# Patient Record
Sex: Female | Born: 1950 | Race: White | Hispanic: No | Marital: Married | Smoking: Never smoker
Health system: Southern US, Community
[De-identification: ages and names within clinical notes are randomized; demographics above are authoritative.]

## PROBLEM LIST (undated history)

## (undated) HISTORY — PX: OOPHORECTOMY: SHX86

## (undated) HISTORY — PX: CHOLECYSTECTOMY: SHX55

## (undated) HISTORY — PX: CARPAL TUNNEL RELEASE: SHX101

---

## 2012-02-26 ENCOUNTER — Other Ambulatory Visit (HOSPITAL_COMMUNITY): Payer: Self-pay | Admitting: Interventional Radiology

## 2012-02-26 ENCOUNTER — Other Ambulatory Visit (HOSPITAL_COMMUNITY): Payer: Self-pay | Admitting: Orthopaedic Surgery

## 2012-02-26 DIAGNOSIS — IMO0002 Reserved for concepts with insufficient information to code with codable children: Secondary | ICD-10-CM

## 2012-03-15 ENCOUNTER — Ambulatory Visit (HOSPITAL_COMMUNITY): Admission: RE | Admit: 2012-03-15 | Payer: Self-pay | Source: Ambulatory Visit

## 2012-04-12 ENCOUNTER — Ambulatory Visit (HOSPITAL_COMMUNITY): Payer: Self-pay

## 2016-11-05 ENCOUNTER — Encounter (INDEPENDENT_AMBULATORY_CARE_PROVIDER_SITE_OTHER): Payer: Self-pay | Admitting: Orthopaedic Surgery

## 2016-11-05 ENCOUNTER — Ambulatory Visit (INDEPENDENT_AMBULATORY_CARE_PROVIDER_SITE_OTHER): Payer: Medicare HMO

## 2016-11-05 ENCOUNTER — Ambulatory Visit (INDEPENDENT_AMBULATORY_CARE_PROVIDER_SITE_OTHER): Payer: Medicare HMO | Admitting: Orthopaedic Surgery

## 2016-11-05 VITALS — BP 116/67 | HR 70 | Resp 14 | Ht 62.0 in | Wt 125.0 lb

## 2016-11-05 DIAGNOSIS — M25511 Pain in right shoulder: Secondary | ICD-10-CM

## 2016-11-05 MED ORDER — METHYLPREDNISOLONE ACETATE 40 MG/ML IJ SUSP
80.0000 mg | INTRAMUSCULAR | Status: AC | PRN
  Administered 2016-11-05: 80 mg

## 2016-11-05 MED ORDER — BUPIVACAINE HCL 0.5 % IJ SOLN
2.0000 mL | INTRAMUSCULAR | Status: AC | PRN
  Administered 2016-11-05: 2 mL via INTRA_ARTICULAR

## 2016-11-05 MED ORDER — LIDOCAINE HCL 1 % IJ SOLN
2.0000 mL | INTRAMUSCULAR | Status: AC | PRN
  Administered 2016-11-05: 2 mL

## 2016-11-05 NOTE — Progress Notes (Signed)
Office Visit Note   Patient: Lori Patterson           Date of Birth: 10/14/1950           MRN: [id number] Visit Date: 11/05/2016              Requested by: No referring provider defined for this encounter. PCP: No primary care provider on file.   Assessment & Plan: Visit Diagnoses:  1. Acute pain of right shoulder   impingement syndrome  Plan: subacromial cortisone injection right shoulder. Monitor results  Follow-Up Instructions: No Follow-up on file.   Orders:  Orders Placed This Encounter  Procedures  . XR Shoulder Right   No orders of the defined types were placed in this encounter.     Procedures: Large Joint Inj Date/Time: 11/05/2016 12:14 PM Performed by: Valeria Batman Authorized by: Valeria Batman   Consent Given by:  Patient Timeout: prior to procedure the correct patient, procedure, and site was verified   Indications:  Pain Location:  Shoulder Site:  L subacromial bursa Prep: patient was prepped and draped in usual sterile fashion   Needle Size:  25 G Needle Length:  1.5 inches Approach:  Lateral Ultrasound Guidance: No   Fluoroscopic Guidance: No   Arthrogram: No   Medications:  80 mg methylPREDNISolone acetate 40 MG/ML; 2 mL lidocaine 1 %; 2 mL bupivacaine 0.5 % Aspiration Attempted: No   Patient tolerance:  Patient tolerated the procedure well with no immediate complications     Clinical Data: No additional findings.   Subjective: Chief Complaint  Patient presents with  . Right Shoulder - Pain    Lori Patterson is a 66 y o that presents with acute Right shoulder pain x 4 weeks. She relates she is driving a lot for work, the pain awakens hurt and feels "cold". Radiation to her R elbow and also into her neck.  insidious onset of right shoulder pain approximately a month ago. No injury or trauma. Having difficulty sleeping at night lying on her right shoulder. No cervical spine symptoms.. No numbness or tingling.  No shortness of breath  or chest pain. No erythema or ecchymosis. Pain seems to be localized along the anterior lateral subacromial region.  HPI  Review of Systems  Constitutional: Negative for chills, fatigue and fever.  Eyes: Negative for itching.  Respiratory: Negative for chest tightness and shortness of breath.   Cardiovascular: Negative for chest pain, palpitations and leg swelling.  Gastrointestinal: Negative for blood in stool, constipation and diarrhea.  Endocrine: Negative for polyuria.  Genitourinary: Negative for dysuria.  Musculoskeletal: Positive for neck pain and neck stiffness. Negative for back pain and joint swelling.  Allergic/Immunologic: Negative for immunocompromised state.  Neurological: Negative for dizziness and numbness.  Hematological: Does not bruise/bleed easily.  Psychiatric/Behavioral: The patient is not nervous/anxious.      Objective: Vital Signs: BP 116/67   Pulse 70   Resp 14   Ht  (1.575 m)   Wt 125 lb (56.7 kg)   BMI 22.86 kg/m   Physical Exam  Ortho Exam awake alert and oriented 3. Comfortable sitting. Minimal pain with overhead motion of right shoulder. Only positive impingement testing. Minimally positive empty can testing compared to left shoulder. Mild tenderness in the area of the acromioclavicular joint right shoulder and anterior subacromial region. Biceps intact.No loss of motion. Good strength. Good grip and good release. Neurovascular Exam intact  No specialty comments available.  Imaging: No results found.  PMFS History: There are no active problems to display for this patient.  No past medical history on file.  Family History  Problem Relation Age of Onset  . Diabetes Mother     Past Surgical History:  Procedure Laterality Date  . CARPAL TUNNEL RELEASE Bilateral   . SHOULDER SURGERY     Social History   Occupational History  . Not on file.   Social History Main Topics  . Smoking status: Never Smoker  . Smokeless tobacco:  Never Used  . Alcohol use No  . Drug use: No  . Sexual activity: Not on file

## 2016-11-12 ENCOUNTER — Ambulatory Visit (INDEPENDENT_AMBULATORY_CARE_PROVIDER_SITE_OTHER): Payer: Self-pay | Admitting: Orthopedic Surgery

## 2016-11-21 ENCOUNTER — Ambulatory Visit (INDEPENDENT_AMBULATORY_CARE_PROVIDER_SITE_OTHER): Payer: Self-pay | Admitting: Orthopaedic Surgery

## 2017-01-28 ENCOUNTER — Encounter (INDEPENDENT_AMBULATORY_CARE_PROVIDER_SITE_OTHER): Payer: Self-pay | Admitting: Orthopedic Surgery

## 2017-01-28 ENCOUNTER — Ambulatory Visit (INDEPENDENT_AMBULATORY_CARE_PROVIDER_SITE_OTHER): Payer: Medicare HMO | Admitting: Orthopedic Surgery

## 2017-01-28 ENCOUNTER — Ambulatory Visit (INDEPENDENT_AMBULATORY_CARE_PROVIDER_SITE_OTHER): Payer: Medicare HMO

## 2017-01-28 VITALS — BP 125/63 | HR 72 | Resp 16 | Ht 60.0 in

## 2017-01-28 DIAGNOSIS — M79671 Pain in right foot: Secondary | ICD-10-CM

## 2017-01-28 DIAGNOSIS — M84374A Stress fracture, right foot, initial encounter for fracture: Secondary | ICD-10-CM

## 2017-01-28 MED ORDER — DICLOFENAC SODIUM 1 % TD GEL: 2.0000 g | Freq: Four times a day (QID) | TRANSDERMAL | 3 refills | Status: DC

## 2017-01-28 NOTE — Progress Notes (Signed)
Office Visit Note   Patient: Lori Patterson           Date of Birth: Oct 04, 1950           MRN: [id number] Visit Date: 01/28/2017              Requested by: No referring provider defined for this encounter. PCP: Elijio MilesWeaver, John W., MD   Assessment & Plan: Visit Diagnoses:  1. Stress fracture of metatarsal bone of right foot, initial encounter   2. Pain in right foot     Plan:  #1: I am going to send her over to the brace shop for an equalizer boot. We did not have a boot to satisfactorily fit her #2: She will check with her medical doctor about Prolia #3: She'll wear the boot for about 2 weeks and then she can shed it and if she is asymptomatic she does not need to return. If it recurs then she needs to follow back up with us  Follow-Up Instructions: Return if symptoms worsen or fail to improve.   Orders:  Orders Placed This Encounter  Procedures  . XR Foot Complete Right   Meds ordered this encounter  Medications  . diclofenac sodium (VOLTAREN) 1 % GEL    Sig: Apply 2-4 g topically 4 (four) times daily.    Dispense:  5 Tube    Refill:  3    Order Specific Question:   Supervising Provider    Answer:   Valeria BatmanWHITFIELD, PETER W [[id number]]      Procedures: No procedures performed   Clinical Data: No additional findings.   Subjective: Chief Complaint  Patient presents with  . Right Foot - Pain    HPI  Lori BeneJoyce is seen today for evaluation of her right foot. She's been having pain in the lateral aspect of her right foot about the fifth metatarsal proximally. These been intermittent and she does have improvement after sleep. However at the end of the day she's been having pain and discomfort in the lateral aspect of the foot. She does have a history of osteopenia and it actually had a five-year stent of Fosamax. Her insurance company has refused prolia. Seen today for evaluation.  Review of Systems  Musculoskeletal: Positive for back pain.  All other systems reviewed and are  negative.    Objective: Vital Signs: BP 125/63 (BP Location: Left Arm, Patient Position: Sitting, Cuff Size: Normal)   Pulse 72   Resp 16   Ht 5' (1.524 m)   BMI 24.41 kg/m   Physical Exam  Ortho Exam  Specialty Comments:  No specialty comments available.  Imaging: Xr Foot Complete Right  Result Date: 01/28/2017 Three-view x-ray of the right foot reveals what appears to be a healing fracture of the proximal fifth metatarsal. She does have a os peroneum noted.    PMFS History: There are no active problems to display for this patient.  History reviewed. No pertinent past medical history.  Family History  Problem Relation Age of Onset  . Diabetes Mother     Past Surgical History:  Procedure Laterality Date  . CARPAL TUNNEL RELEASE Bilateral   . CHOLECYSTECTOMY    . OOPHORECTOMY     Social History   Occupational History  . Not on file  Tobacco Use  . Smoking status: Never Smoker  . Smokeless tobacco: Never Used  Substance and Sexual Activity  . Alcohol use: Yes    Comment: occasional   . Drug use: No  .  Sexual activity: Not on file

## 2017-02-25 ENCOUNTER — Telehealth (INDEPENDENT_AMBULATORY_CARE_PROVIDER_SITE_OTHER): Payer: Self-pay | Admitting: Orthopaedic Surgery

## 2017-02-25 NOTE — Telephone Encounter (Signed)
Lori Patterson left a voicemail stating that there was no diagnosis code on the fax that was sent over for this patient.  (No other information was left on machine)  CB # [phone]  Please advise

## 2017-02-26 NOTE — Telephone Encounter (Signed)
re-faxed

## 2017-03-09 ENCOUNTER — Encounter (INDEPENDENT_AMBULATORY_CARE_PROVIDER_SITE_OTHER): Payer: Self-pay | Admitting: Orthopaedic Surgery

## 2017-03-09 ENCOUNTER — Ambulatory Visit (INDEPENDENT_AMBULATORY_CARE_PROVIDER_SITE_OTHER): Payer: Medicare HMO | Admitting: Orthopaedic Surgery

## 2017-03-09 ENCOUNTER — Encounter (INDEPENDENT_AMBULATORY_CARE_PROVIDER_SITE_OTHER): Payer: Self-pay

## 2017-03-09 VITALS — BP 115/68 | HR 83 | Resp 14 | Ht 63.0 in | Wt 130.0 lb

## 2017-03-09 DIAGNOSIS — M25511 Pain in right shoulder: Secondary | ICD-10-CM | POA: Diagnosis not present

## 2017-03-09 DIAGNOSIS — G8929 Other chronic pain: Secondary | ICD-10-CM

## 2017-03-09 MED ORDER — METHYLPREDNISOLONE ACETATE 40 MG/ML IJ SUSP
40.0000 mg | INTRAMUSCULAR | Status: AC | PRN
  Administered 2017-03-09: 40 mg via INTRAMUSCULAR

## 2017-03-09 MED ORDER — LIDOCAINE HCL 1 % IJ SOLN
2.0000 mL | INTRAMUSCULAR | Status: AC | PRN
  Administered 2017-03-09: 2 mL

## 2017-03-09 NOTE — Progress Notes (Signed)
Office Visit Note   Patient: Lori Patterson           Date of Birth: 22-Oct-1950           MRN: [id number] Visit Date: 03/09/2017              Requested by: Elijio MilesWeaver, John W., MD [address] [place], [place] [id number] PCP: Elijio MilesWeaver, John W., MD   Assessment & Plan: Visit Diagnoses:  1. Chronic right shoulder pain     Plan: Areas of trigger point tenderness along the levator scapular muscle right shoulder. We'll inject these with Depo-Medrol and monitor her response. Also try Voltaren gel. Has a separate issue with her right shoulder we'll consider injecting that the next 3-4 weeks if no improvement with the above  Follow-Up Instructions: Return if symptoms worsen or fail to improve.   Orders:  Orders Placed This Encounter  Procedures  . Trigger Point Inj   No orders of the defined types were placed in this encounter.     Procedures: Trigger Point Inj Date/Time: 03/09/2017 2:49 PM Performed by: Valeria BatmanWhitfield, Jahlil Ziller W, MD Authorized by: Valeria BatmanWhitfield, Tyianna Menefee W, MD   Consent Given by:  Patient Indications:  Pain Total # of Trigger Points:  3 or more Location: shoulder   Needle Size:  27 G Approach:  Dorsal Medications #1:  2 mL lidocaine 1 % Medications #2:  40 mg methylPREDNISolone acetate 40 MG/ML     Clinical Data: No additional findings.   Subjective: Chief Complaint  Patient presents with  . Right Shoulder - Follow-up, Pain  Lori BeneJoyce was seen at the end of September 2018 for right shoulder pain. She had evidence of impingement. Subacromial cortisone injection  resulted in excellent pain relief. She's had some recurrence associated with some areas of trigger point tenderness along the vertebral border of the right scapula and levator scapular muscle. No injury or trauma. No numbness or tingling or specific neck pain. Spends a lot of time in the car during the course of her work day  HPI  Review of Systems  Constitutional: Negative for chills, fatigue and fever.  Eyes:  Negative for itching.  Respiratory: Negative for chest tightness and shortness of breath.   Cardiovascular: Negative for chest pain, palpitations and leg swelling.  Gastrointestinal: Negative for blood in stool, constipation and diarrhea.  Endocrine: Negative for polyuria.  Genitourinary: Negative for dysuria.  Musculoskeletal: Positive for neck stiffness. Negative for back pain, joint swelling and neck pain.  Allergic/Immunologic: Negative for immunocompromised state.  Neurological: Negative for dizziness and numbness.  Hematological: Does not bruise/bleed easily.  Psychiatric/Behavioral: Positive for sleep disturbance. The patient is not nervous/anxious.      Objective: Vital Signs: BP 115/68   Pulse 83   Resp 14   Ht 5\' 3"  (1.6 m)   Wt 130 lb (59 kg)   BMI 23.03 kg/m   Physical Exam  Ortho Exam appears to have 2 separate problems with her right shoulder. She had several areas of trigger point tenderness along the levator scapular muscle. Muscle is tight. Some mild areas of tenderness along the vertebral border of the scapula. No significant impingement or empty can testing right shoulder. Full quick overhead motion with good strength. Biceps intact neurovascular exam intact. No pain to range of motion of the cervical spine  Specialty Comments:  No specialty comments available.  Imaging: No results found.   PMFS History: There are no active problems to display for this patient.  History reviewed. No pertinent  past medical history.  Family History  Problem Relation Age of Onset  . Diabetes Mother     Past Surgical History:  Procedure Laterality Date  . CARPAL TUNNEL RELEASE Bilateral   . CHOLECYSTECTOMY    . OOPHORECTOMY     Social History   Occupational History  . Not on file  Tobacco Use  . Smoking status: Never Smoker  . Smokeless tobacco: Never Used  Substance and Sexual Activity  . Alcohol use: Yes    Comment: occasional   . Drug use: No  . Sexual  activity: Not on file

## 2019-03-30 ENCOUNTER — Ambulatory Visit: Payer: Medicare HMO | Admitting: Orthopaedic Surgery

## 2019-03-30 ENCOUNTER — Encounter: Payer: Self-pay | Admitting: Orthopaedic Surgery

## 2019-03-30 ENCOUNTER — Other Ambulatory Visit: Payer: Self-pay

## 2019-03-30 DIAGNOSIS — G8929 Other chronic pain: Secondary | ICD-10-CM

## 2019-03-30 DIAGNOSIS — M25511 Pain in right shoulder: Secondary | ICD-10-CM | POA: Diagnosis not present

## 2019-03-30 MED ORDER — METHYLPREDNISOLONE ACETATE 40 MG/ML IJ SUSP
80.0000 mg | INTRAMUSCULAR | Status: AC | PRN
  Administered 2019-03-30: 11:00:00 80 mg via INTRA_ARTICULAR

## 2019-03-30 MED ORDER — BUPIVACAINE HCL 0.5 % IJ SOLN
2.0000 mL | INTRAMUSCULAR | Status: AC | PRN
  Administered 2019-03-30: 11:00:00 2 mL via INTRA_ARTICULAR

## 2019-03-30 MED ORDER — LIDOCAINE HCL 2 % IJ SOLN
2.0000 mL | INTRAMUSCULAR | Status: AC | PRN
  Administered 2019-03-30: 11:00:00 2 mL

## 2019-03-30 NOTE — Progress Notes (Signed)
Office Visit Note   Patient: Lori Patterson           Date of Birth: 1950/12/31           MRN: [id number] Visit Date: 03/30/2019              Requested by: Derrill Center., MD [address] [place],  [place] [address] PCP: Derrill Center., MD   Assessment & Plan: Visit Diagnoses:  1. Chronic right shoulder pain     Plan: Impingement syndrome right shoulder.  There is some associated spasm of the levator scapula.  I injected the areas of trigger point tenderness of levator scapula 2 years ago and it made a difference but I think there is a greater element of impingement today so I am going to inject the subacromial space and monitor her response.  She can try heat or the Voltaren gel or even Aspercreme for the spasm.  She has a home exercise program  Follow-Up Instructions: Return if symptoms worsen or fail to improve.   Orders:  Orders Placed This Encounter  Procedures  . Large Joint Inj: R subacromial bursa   No orders of the defined types were placed in this encounter.     Procedures: Large Joint Inj: R subacromial bursa on 03/30/2019 10:40 AM Indications: pain and diagnostic evaluation Details: 25 G 1.5 in needle, anterolateral approach  Arthrogram: No  Medications: 2 mL lidocaine 2 %; 2 mL bupivacaine 0.5 %; 80 mg methylPREDNISolone acetate 40 MG/ML Consent was given by the patient. Immediately prior to procedure a time out was called to verify the correct patient, procedure, equipment, support staff and site/side marked as required. Patient was prepped and draped in the usual sterile fashion.       Clinical Data: No additional findings.   Subjective: Chief Complaint  Patient presents with  . Right Shoulder - Pain  Patient presents today for recurrent right shoulder pain. She was last evaluated two years ago and received a trigger point injection in her shoulder. The pain that she was having before has started again in the last few weeks. Her pain is located  posteriorly and goes down her arm. She has good range of motion. Occasionally she will take something over the counter. She has taken old celebrex in her cabinet and had good relief, she took it again and had a reaction that day with itching and face redness, but not sure if it is related to the celebrex. No numbness, tingling, or weakness. The pain wakes her at night.  Seen 2 years ago for similar problem with the right shoulder.  X-rays were nondiagnostic.  I did inject the areas of trigger point tenderness about the levator scapular with some improvement.  There seems to be more of an element of shoulder pain on this occasion.  No neck discomfort.  No numbness or tingling  HPI  Review of Systems   Objective: Vital Signs: Ht 5' (1.524 m)   Wt 114 lb (51.7 kg)   BMI 22.26 kg/m   Physical Exam Constitutional:      Appearance: She is well-developed.  Eyes:     Pupils: Pupils are equal, round, and reactive to light.  Pulmonary:     Effort: Pulmonary effort is normal.  Skin:    General: Skin is warm and dry.  Neurological:     Mental Status: She is alert and oriented to person, place, and time.  Psychiatric:        Behavior:  Behavior normal.     Ortho Exam awake alert and oriented x3.  Comfortable sitting minimally positive impingement and empty can testing right shoulder.  Negative Speed sign.  Good strength.  Full range of motion with overhead and abduction but little loss of internal rotation she could touch the small of her back not the middle of her back.  She could on the left.  No grinding or crepitation.  Little bit of tenderness along the anterior subacromial region.  There was spasm of the levator scapula on the right as it was tight but minimally uncomfortable.  No pain with range of motion of the cervical spine with full flexion extension and rotation  Specialty Comments:  No specialty comments available.  Imaging: No results found.   PMFS History: Patient Active  Problem List   Diagnosis Date Noted  . Pain in right shoulder 03/30/2019   History reviewed. No pertinent past medical history.  Family History  Problem Relation Age of Onset  . Diabetes Mother     Past Surgical History:  Procedure Laterality Date  . CARPAL TUNNEL RELEASE Bilateral   . CHOLECYSTECTOMY    . OOPHORECTOMY     Social History   Occupational History  . Not on file  Tobacco Use  . Smoking status: Never Smoker  . Smokeless tobacco: Never Used  Substance and Sexual Activity  . Alcohol use: Yes    Comment: occasional   . Drug use: No  . Sexual activity: Not on file

## 2019-07-21 ENCOUNTER — Ambulatory Visit (INDEPENDENT_AMBULATORY_CARE_PROVIDER_SITE_OTHER): Payer: Medicare HMO

## 2019-07-21 ENCOUNTER — Ambulatory Visit: Payer: Medicare HMO | Admitting: Orthopedic Surgery

## 2019-07-21 ENCOUNTER — Ambulatory Visit: Payer: Self-pay

## 2019-07-21 ENCOUNTER — Other Ambulatory Visit: Payer: Self-pay

## 2019-07-21 ENCOUNTER — Encounter: Payer: Self-pay | Admitting: Orthopedic Surgery

## 2019-07-21 VITALS — Ht 60.0 in | Wt 114.0 lb

## 2019-07-21 DIAGNOSIS — M25562 Pain in left knee: Secondary | ICD-10-CM | POA: Diagnosis not present

## 2019-07-21 DIAGNOSIS — M25561 Pain in right knee: Secondary | ICD-10-CM

## 2019-07-21 DIAGNOSIS — G8929 Other chronic pain: Secondary | ICD-10-CM

## 2019-07-21 MED ORDER — METHYLPREDNISOLONE ACETATE 40 MG/ML IJ SUSP
80.0000 mg | INTRAMUSCULAR | Status: AC | PRN
  Administered 2019-07-21: 80 mg via INTRA_ARTICULAR

## 2019-07-21 MED ORDER — BUPIVACAINE HCL 0.25 % IJ SOLN
2.0000 mL | INTRAMUSCULAR | Status: AC | PRN
  Administered 2019-07-21: 2 mL via INTRA_ARTICULAR

## 2019-07-21 MED ORDER — LIDOCAINE HCL 1 % IJ SOLN
2.0000 mL | INTRAMUSCULAR | Status: AC | PRN
  Administered 2019-07-21: 2 mL

## 2019-07-21 NOTE — Progress Notes (Signed)
Office Visit Note   Patient: Lori Patterson           Date of Birth: 09-24-1950           MRN: [id number] Visit Date: 07/21/2019              Requested by: Derrill Center., MD [address] [place],  [place] [address] PCP: Derrill Center., MD   Assessment & Plan: Visit Diagnoses:  1. Chronic pain of both knees     Plan:  #1: Corticosteroid injection to the right knee was given without difficulty. #2: If she does well with this and would like to have the left knee injected she can make another appointment otherwise follow back up as needed.  Follow-Up Instructions: Return if symptoms worsen or fail to improve.   Orders:  Orders Placed This Encounter  Procedures  . XR KNEE 3 VIEW LEFT  . XR KNEE 3 VIEW RIGHT   No orders of the defined types were placed in this encounter.     Procedures: Large Joint Inj: R knee on 07/21/2019 2:01 PM Indications: pain and diagnostic evaluation Details: 25 G 1.5 in needle, anteromedial approach  Arthrogram: No  Medications: 2 mL lidocaine 1 %; 80 mg methylPREDNISolone acetate 40 MG/ML; 2 mL bupivacaine 0.25 % Outcome: tolerated well, no immediate complications Procedure, treatment alternatives, risks and benefits explained, specific risks discussed. Consent was given by the patient. Immediately prior to procedure a time out was called to verify the correct patient, procedure, equipment, support staff and site/side marked as required. Patient was prepped and draped in the usual sterile fashion.       Clinical Data: No additional findings.   Subjective: Chief Complaint  Patient presents with  . Right Knee - Pain   HPI Patient presents today for right knee pain. April 16th she was stepping back off a step stool and twisted her right knee. She said that she rested it and iced it. It hurt for a couple weeks and improved. Her pain was located laterally. She said that it would catch on and off after that injury. On May 25th she was on  her feet and walking a lot for a luncheon, and noticed that her knee was very swollen. She states that it started to catch more often and really bothered her. She started to wear a knee brace but stopped wearing it one week ago. Her knee has improved a lot since last week. She said that her left knee started bothering her in May, and feels like it started from compensating for the right knee. She said that both knees still seem to flare with prolonged activity and motion. She has taken limited amounts of OTC medicine.    Review of Systems  Constitutional: Negative for fatigue.  HENT: Negative for ear pain.   Eyes: Negative for pain.  Respiratory: Negative for shortness of breath.   Cardiovascular: Negative for leg swelling.  Gastrointestinal: Negative for constipation and diarrhea.  Endocrine: Negative for cold intolerance and heat intolerance.  Genitourinary: Negative for difficulty urinating.  Musculoskeletal: Positive for joint swelling.  Skin: Negative for rash.  Allergic/Immunologic: Negative for food allergies.  Neurological: Positive for weakness.  Hematological: Does not bruise/bleed easily.  Psychiatric/Behavioral: Negative for sleep disturbance.     Objective: Vital Signs: Ht 5' (1.524 m)   Wt 114 lb (51.7 kg)   BMI 22.26 kg/m   Physical Exam  Ortho Exam  Specialty Comments:  No specialty comments available.  Imaging: No results found.   PMFS History: Patient Active Problem List   Diagnosis Date Noted  . Pain in right shoulder 03/30/2019   History reviewed. No pertinent past medical history.  Family History  Problem Relation Age of Onset  . Diabetes Mother     Past Surgical History:  Procedure Laterality Date  . CARPAL TUNNEL RELEASE Bilateral   . CHOLECYSTECTOMY    . OOPHORECTOMY     Social History   Occupational History  . Not on file  Tobacco Use  . Smoking status: Never Smoker  . Smokeless tobacco: Never Used  Vaping Use  . Vaping Use: Never  used  Substance and Sexual Activity  . Alcohol use: Yes    Comment: occasional   . Drug use: No  . Sexual activity: Not on file

## 2019-08-31 ENCOUNTER — Other Ambulatory Visit: Payer: Self-pay

## 2019-08-31 ENCOUNTER — Encounter: Payer: Self-pay | Admitting: Orthopaedic Surgery

## 2019-08-31 ENCOUNTER — Ambulatory Visit: Payer: Medicare HMO | Admitting: Orthopaedic Surgery

## 2019-08-31 DIAGNOSIS — M170 Bilateral primary osteoarthritis of knee: Secondary | ICD-10-CM

## 2019-08-31 NOTE — Progress Notes (Signed)
Office Visit Note   Patient: Lori Patterson           Date of Birth: [birth year]           MRN: [id number] Visit Date: 08/31/2019              Requested by: Elijio Miles., MD [address] [place],  [place] [address] PCP: Elijio Miles., MD   Assessment & Plan: Visit Diagnoses:  1. Bilateral primary osteoarthritis of knee     Plan: Lori Patterson experience rather acute onset of right knee pain in April particularly on the lateral aspect of her knee.  She notes that over time the pain slowly subsided but she still had episodes of her knee "giving way.  She saw Lori Patterson last month with x-rays demonstrating significant arthritis particularly about the patellofemoral joints bilaterally but with moderate changes in both the medial lateral compartments.  He had suggested some Voltaren gel and even noted that he can she injected her right knee.  Lori Patterson relates that she never had the injection so today I am injecting her right knee.  We have also discussed use of NSAIDs and the Voltaren gel, appropriate exercises and bracing.  At some point she may be a candidate for knee replacement.  Follow-Up Instructions: Return if symptoms worsen or fail to improve.   Orders:  No orders of the defined types were placed in this encounter.  No orders of the defined types were placed in this encounter.     Procedures: No procedures performed   Clinical Data: No additional findings.   Subjective: Chief Complaint  Patient presents with  . Right Knee - Follow-up  Patient presents today for follow up on her knees. She was here on 07/21/2019 and saw Lori Patterson. She states that she was not given any cortisone injections at that visit, but was instructed to use Voltaren gel up to four times daily. She states that her knee flared up two weeks ago, but was unable to get an appointment through the front desk with Lori Patterson. Her knee has since improved and the swelling has decreased. She has stopped using the Voltaren  gel and has since went back to using her AsperCream. She does not like to take oral medications.   HPI  Review of Systems   Objective: Vital Signs: Ht 5' (1.524 m)   Wt 114 lb (51.7 kg)   BMI 22.26 kg/m   Physical Exam Constitutional:      Appearance: She is well-developed.  Eyes:     Pupils: Pupils are equal, round, and reactive to light.  Pulmonary:     Effort: Pulmonary effort is normal.  Skin:    General: Skin is warm and dry.  Neurological:     Mental Status: She is alert and oriented to person, place, and time.  Psychiatric:        Behavior: Behavior normal.     Ortho Exam awake alert and oriented x3.  Comfortable sitting minimal effusion right knee.  Pain along the lateral patella consistent with lateral patella pressure syndrome.  Films demonstrate significant patellofemoral arthritis with narrowing of the lateral patellofemoral articulation.  Full extension flexed over 105 degrees without instability.  No significant medial or lateral joint pain.  No pain along the IT band.  No calf pain.  No distal edema.  Neurologically intact.  Painless range of motion of right hip.  There was some fullness in the popliteal space probably consistent with a popliteal cyst but without pain  Specialty Comments:  No specialty comments available.  Imaging: No results found.   PMFS History: Patient Active Problem List   Diagnosis Date Noted  . Bilateral primary osteoarthritis of knee 08/31/2019  . Pain in right shoulder 03/30/2019   History reviewed. No pertinent past medical history.  Family History  Problem Relation Age of Onset  . Diabetes Mother     Past Surgical History:  Procedure Laterality Date  . CARPAL TUNNEL RELEASE Bilateral   . CHOLECYSTECTOMY    . OOPHORECTOMY     Social History   Occupational History  . Not on file  Tobacco Use  . Smoking status: Never Smoker  . Smokeless tobacco: Never Used  Vaping Use  . Vaping Use: Never used  Substance and Sexual  Activity  . Alcohol use: Yes    Comment: occasional   . Drug use: No  . Sexual activity: Not on file

## 2020-03-22 ENCOUNTER — Ambulatory Visit: Payer: Medicare HMO | Admitting: Orthopaedic Surgery

## 2020-03-22 ENCOUNTER — Telehealth: Payer: Self-pay

## 2020-03-22 ENCOUNTER — Other Ambulatory Visit: Payer: Self-pay

## 2020-03-22 ENCOUNTER — Ambulatory Visit (INDEPENDENT_AMBULATORY_CARE_PROVIDER_SITE_OTHER): Payer: Medicare HMO

## 2020-03-22 ENCOUNTER — Encounter: Payer: Self-pay | Admitting: Orthopaedic Surgery

## 2020-03-22 VITALS — Ht 60.0 in | Wt 114.0 lb

## 2020-03-22 DIAGNOSIS — M79672 Pain in left foot: Secondary | ICD-10-CM | POA: Diagnosis not present

## 2020-03-22 DIAGNOSIS — M170 Bilateral primary osteoarthritis of knee: Secondary | ICD-10-CM

## 2020-03-22 MED ORDER — BUPIVACAINE HCL 0.25 % IJ SOLN
2.0000 mL | INTRAMUSCULAR | Status: AC | PRN
  Administered 2020-03-22: 2 mL via INTRA_ARTICULAR

## 2020-03-22 NOTE — Telephone Encounter (Signed)
Patient called she wanted to schedule an appointment with Dr.Whitfield I did inform patient his next available will be 3/1 she would like to know if she can be worked into Dr.Whitfields scheduled soon for a xray call back:[phone]

## 2020-03-22 NOTE — Progress Notes (Signed)
Office Visit Note   Patient: Lori Patterson           Date of Birth: 09-07-1950           MRN: [id number] Visit Date: 03/22/2020              Requested by: Elijio Miles., MD [address] [place],  [place] [address] PCP: Elijio Miles., MD   Assessment & Plan: Visit Diagnoses:  1. Pain in left foot   2. Bilateral primary osteoarthritis of knee     Plan: Lori Patterson has documented osteoarthritis of both knees and has responded nicely to cortisone in the past. She is more symptomatic on the right and I will inject that today.  Also recently developed acute onset of lateral left foot pain near the base of the fifth metatarsal. I did not see anything of significance on the x-ray except os perineum which may or may not be symptomatic in an area of sclerosis in the cuboid. She is more tender at the very base of the fifth metatarsal might just have a peroneus brevis tendinitis. The skin was intact and had little if any tenderness to palpation. The issue is more of weightbearing. We will monitor this and consider the local cortisone injections or possibly an MRI scan if it persists. She is feeling a little better  Follow-Up Instructions: Return if symptoms worsen or fail to improve.   Orders:  Orders Placed This Encounter  Procedures  . Large Joint Inj: R knee  . XR Foot Complete Left   No orders of the defined types were placed in this encounter.     Procedures: Large Joint Inj: R knee on 03/22/2020 4:24 PM Indications: pain and diagnostic evaluation Details: 25 G 1.5 in needle, anterolateral approach  Arthrogram: No  Medications: 2 mL bupivacaine 0.25 %  12 mg betamethasone injected with Marcaine into the anterior lateral compartment right knee Procedure, treatment alternatives, risks and benefits explained, specific risks discussed. Consent was given by the patient. Immediately prior to procedure a time out was called to verify the correct patient, procedure, equipment, support  staff and site/side marked as required. Patient was prepped and draped in the usual sterile fashion.       Clinical Data: No additional findings.   Subjective: Chief Complaint  Patient presents with  . Left Foot - Pain  . Right Knee - Pain  Patient presents today for her left foot and right knee. She said that her left foot started to hurt about a week ago. No known injury. Her pain is located laterally. She only has pain with weightbearing. She also states that her right knee has been painful. She had it injected in July of last year and that helped. She has noticed that her knee is swollen posteriorly. She wants to get another injection today. She is not taking anything for pain.   HPI  Review of Systems   Objective: Vital Signs: Ht 5' (1.524 m)   Wt 114 lb (51.7 kg)   BMI 22.26 kg/m   Physical Exam Constitutional:      Appearance: She is well-developed and well-nourished.  HENT:     Mouth/Throat:     Mouth: Oropharynx is clear and moist.  Eyes:     Extraocular Movements: EOM normal.     Pupils: Pupils are equal, round, and reactive to light.  Pulmonary:     Effort: Pulmonary effort is normal.  Skin:    General: Skin is warm and  dry.  Neurological:     Mental Status: She is alert and oriented to person, place, and time.  Psychiatric:        Mood and Affect: Mood and affect normal.        Behavior: Behavior normal.     Ortho Exam awake alert and oriented x3. Comfortable sitting. Examination left foot reveals no swelling. There really was not any significant tenderness along the lateral aspect of her foot to palpation. She seems to think that when she has the pain it is at the very base of the fifth metatarsal. Peroneal tendon and function intact. No pain behind either malleolar lie. No specific tenderness over the os perineum or the cuboid where there was an area of increased density on x-ray. Neurologically intact. Motor exam intact  Specialty Comments:  No  specialty comments available.  Imaging: XR Foot Complete Left  Result Date: 03/22/2020 Films of the left foot were obtained in several projections. There is a prominent os perineum and an area of sclerosis in the cuboid which may or may not be symptomatic. No evidence of a stress fracture. No obvious arthritic change in the midfoot. Patient having occasional pain in the area of the base of the fifth metatarsal    PMFS History: Patient Active Problem List   Diagnosis Date Noted  . Pain in left foot 03/22/2020  . Bilateral primary osteoarthritis of knee 08/31/2019  . Pain in right shoulder 03/30/2019   History reviewed. No pertinent past medical history.  Family History  Problem Relation Age of Onset  . Diabetes Mother     Past Surgical History:  Procedure Laterality Date  . CARPAL TUNNEL RELEASE Bilateral   . CHOLECYSTECTOMY    . OOPHORECTOMY     Social History   Occupational History  . Not on file  Tobacco Use  . Smoking status: Never Smoker  . Smokeless tobacco: Never Used  Vaping Use  . Vaping Use: Never used  Substance and Sexual Activity  . Alcohol use: Yes    Comment: occasional   . Drug use: No  . Sexual activity: Not on file

## 2020-03-22 NOTE — Telephone Encounter (Signed)
Called and spoke with patient. Scheduled for this afternoon.  

## 2020-07-26 ENCOUNTER — Encounter: Payer: Self-pay | Admitting: Orthopaedic Surgery

## 2020-07-26 ENCOUNTER — Ambulatory Visit: Payer: Medicare HMO | Admitting: Orthopaedic Surgery

## 2020-07-26 ENCOUNTER — Other Ambulatory Visit: Payer: Self-pay

## 2020-07-26 VITALS — Ht 60.0 in | Wt 114.0 lb

## 2020-07-26 DIAGNOSIS — M79675 Pain in left toe(s): Secondary | ICD-10-CM

## 2020-07-26 DIAGNOSIS — M170 Bilateral primary osteoarthritis of knee: Secondary | ICD-10-CM | POA: Diagnosis not present

## 2020-07-26 DIAGNOSIS — M25511 Pain in right shoulder: Secondary | ICD-10-CM

## 2020-07-26 DIAGNOSIS — M79672 Pain in left foot: Secondary | ICD-10-CM | POA: Diagnosis not present

## 2020-07-26 MED ORDER — LIDOCAINE HCL 1 % IJ SOLN
1.0000 mL | INTRAMUSCULAR | Status: AC | PRN
  Administered 2020-07-26: 1 mL

## 2020-07-26 NOTE — Progress Notes (Signed)
Office Visit Note   Patient: Lori Patterson           Date of Birth: 1950-07-25           MRN: [id number] Visit Date: 07/26/2020              Requested by: Elijio Miles., MD [address] [place],  [place] [address] PCP: Elijio Miles., MD   Assessment & Plan: Visit Diagnoses:  1. Pain in left foot     Plan: Jesusita was seen earlier in the year for evaluation of left foot pain.  She did have an os perineum that could have been because of pain.  There was also an area of sclerosis in the cuboid which could have been a stress reaction.  There was no evidence of any fracture.  She has had pain off and on but a recent exacerbation.  The pain is fairly well localized near the base of the fifth metatarsal.  I am going to inject that area with betamethasone and Xylocaine and monitor response.  Consider MRI scan if no improvement.  Also has significant arthritis in both of her knees and treating it with over-the-counter medicines and exercise.  At some point she may be a candidate for knee replacement.  She has had cortisone in the past.  Also experiencing some right shoulder pain and could have a rotator cuff tear as suggested at some point when she is compromised to consider an MRI scan  Follow-Up Instructions: Return if symptoms worsen or fail to improve.   Orders:  Orders Placed This Encounter  Procedures   Foot Inj   No orders of the defined types were placed in this encounter.     Procedures: Foot Inj  Date/Time: 07/26/2020 5:26 PM Performed by: Valeria Batman, MD Authorized by: Valeria Batman, MD   Consent Given by:  Patient Site marked: the procedure site was marked   Indications:  Pain Condition: Plantar Fasciitis   Condition comment:  Midfoot pain Location: left plantar fascia muscle   Approach:  Lateral Medications:  1 mL lidocaine 1 %  6 mg betamethasone injected with Xylocaine into the lateral aspect of the midfoot   Clinical Data: No additional  findings.   Subjective: Chief Complaint  Patient presents with   Left Foot - Pain, Follow-up  Patient presents today for left foot pain. She was here four months ago with left foot pain. She said that it settled down after that visit. A few weeks ago she stood on her tip toes to reach something and that caused her left foot to flare up again. Her pain is located at the 5th metatarsal. She has tried Naproxen, and it does help with the pain, but it also causes her to retain water.   HPI  Review of Systems   Objective: Vital Signs: Ht 5' (1.524 m)   Wt 114 lb (51.7 kg)   BMI 22.26 kg/m   Physical Exam Constitutional:      Appearance: She is well-developed.  Eyes:     Pupils: Pupils are equal, round, and reactive to light.  Pulmonary:     Effort: Pulmonary effort is normal.  Skin:    General: Skin is warm and dry.  Neurological:     Mental Status: She is alert and oriented to person, place, and time.  Psychiatric:        Behavior: Behavior normal.    Ortho Exam left foot with some tenderness near the base  of the fifth metatarsal but a little bit more dorsal.  Skin intact.  No ecchymosis or erythema.  No grating or crepitation.  Seems to have more trouble when she stands on her toes then when she is on her heel.  Peroneal tendon intact.  No specific pain at the very base of the fifth metatarsal.  No ankle pain.  There is no pain behind either malleoli.  Motor and sensory exam intact  Specialty Comments:  No specialty comments available.  Imaging: No results found.   PMFS History: Patient Active Problem List   Diagnosis Date Noted   Pain in left foot 03/22/2020   Bilateral primary osteoarthritis of knee 08/31/2019   Pain in right shoulder 03/30/2019   History reviewed. No pertinent past medical history.  Family History  Problem Relation Age of Onset   Diabetes Mother     Past Surgical History:  Procedure Laterality Date   CARPAL TUNNEL RELEASE Bilateral     CHOLECYSTECTOMY     OOPHORECTOMY     Social History   Occupational History   Not on file  Tobacco Use   Smoking status: Never   Smokeless tobacco: Never  Vaping Use   Vaping Use: Never used  Substance and Sexual Activity   Alcohol use: Yes    Comment: occasional    Drug use: No   Sexual activity: Not on file

## 2020-08-27 ENCOUNTER — Other Ambulatory Visit: Payer: Self-pay

## 2020-08-27 ENCOUNTER — Telehealth: Payer: Self-pay

## 2020-08-27 DIAGNOSIS — M79672 Pain in left foot: Secondary | ICD-10-CM

## 2020-08-27 NOTE — Telephone Encounter (Signed)
MRI left foot-chronic pain near the base of the fifth metatarsal

## 2020-08-27 NOTE — Telephone Encounter (Signed)
Pt called stating that she is still having pain in her foot and would like to follow through with getting an MRI

## 2020-08-27 NOTE — Telephone Encounter (Signed)
Ordered MRI of left foot. When I let patient know she requested an [facility] at [facility] [place]. Do they do it there? If so, please change location. :)

## 2020-08-28 NOTE — Telephone Encounter (Signed)
Order changed to [facility] [place]

## 2020-08-30 ENCOUNTER — Telehealth: Payer: Self-pay | Admitting: Orthopaedic Surgery

## 2020-08-30 NOTE — Telephone Encounter (Signed)
Pt has MRI Saturday and would like someone to call her with the MRI results when they're received.   CB [phone]

## 2020-09-01 ENCOUNTER — Ambulatory Visit (INDEPENDENT_AMBULATORY_CARE_PROVIDER_SITE_OTHER): Payer: Medicare HMO

## 2020-09-01 ENCOUNTER — Other Ambulatory Visit: Payer: Self-pay

## 2020-09-01 ENCOUNTER — Ambulatory Visit (HOSPITAL_BASED_OUTPATIENT_CLINIC_OR_DEPARTMENT_OTHER): Payer: Medicare HMO

## 2020-09-01 DIAGNOSIS — M79672 Pain in left foot: Secondary | ICD-10-CM

## 2020-09-03 NOTE — Telephone Encounter (Signed)
Called with results-coming to office Tues for an equalizer boot-told her to come after 1:00 but if you prefer a specific time please call her-thanks

## 2020-09-03 NOTE — Telephone Encounter (Signed)
Holding for Lauren. ?

## 2020-09-04 ENCOUNTER — Ambulatory Visit (INDEPENDENT_AMBULATORY_CARE_PROVIDER_SITE_OTHER): Payer: Medicare HMO

## 2020-09-04 ENCOUNTER — Other Ambulatory Visit: Payer: Self-pay

## 2020-09-04 DIAGNOSIS — M79672 Pain in left foot: Secondary | ICD-10-CM

## 2020-09-04 NOTE — Progress Notes (Signed)
Fit patient with a size small boot.

## 2020-09-11 ENCOUNTER — Ambulatory Visit: Payer: Medicare HMO | Admitting: Orthopaedic Surgery

## 2020-09-25 ENCOUNTER — Encounter: Payer: Self-pay | Admitting: Orthopaedic Surgery

## 2020-09-25 ENCOUNTER — Other Ambulatory Visit: Payer: Self-pay

## 2020-09-25 ENCOUNTER — Ambulatory Visit: Payer: Medicare HMO | Admitting: Orthopaedic Surgery

## 2020-09-25 VITALS — Ht 60.0 in | Wt 114.0 lb

## 2020-09-25 DIAGNOSIS — M79672 Pain in left foot: Secondary | ICD-10-CM | POA: Diagnosis not present

## 2020-09-25 NOTE — Progress Notes (Signed)
Office Visit Note   Patient: Lori Patterson           Date of Birth: 14-Dec-1950           MRN: [id number] Visit Date: 09/25/2020              Requested by: Elijio Miles., MD [address] [place],  [place] [address] PCP: Elijio Miles., MD   Assessment & Plan: Visit Diagnoses:  1. 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  She localizes her pain to that area.  She also has an area in the cuboid which could represent stress reaction or healing stress fracture.  I am not sure that she is symptomatic in that area.  She has been wearing an equalizer boot for about 3 weeks and not sure its made much of a difference.  Her pain is still localized at the base of the fifth metatarsal or near to that consistent with the os peroneum syndrome.  I would like her to continue with the boot and or ankle support combination for another 3 to 4 weeks and then return.  And not sure another injection make much difference.  The other option would be to explore the peroneal tendons distally and remove the os perineum reattach the tendons.  Have discussed that with her briefly.  Would refer her to Dr. Lajoyce Corners should that be the decision  Follow-Up Instructions: Return in about 1 month (around 10/26/2020).   Orders:  No orders of the defined types were placed in this encounter.  No orders of the defined types were placed in this encounter.     Procedures: No procedures performed   Clinical Data: No additional findings.   Subjective: Chief Complaint  Patient presents with   Left Foot - Follow-up    MRI review  Patient is here today for follow up on her left foot. She had an MRI done and is here today to discuss those results.  Has been wearing the equalizer boot for about 3 weeks and not sure its made much of a difference.   The pain is still localized near the base of the left fifth metatarsal  HPI  Review of Systems   Objective: Vital Signs: Ht 5' (1.524 m)   Wt 114 lb (51.7 kg)   BMI 22.26 kg/m   Physical Exam Constitutional:      Appearance: She is well-developed.  Pulmonary:     Effort: Pulmonary effort is normal.  Skin:    General: Skin is warm and dry.  Neurological:     Mental Status: She is alert and oriented to person, place, and time.  Psychiatric:        Behavior: Behavior normal.    Ortho Exam left foot without swelling.  No induration or edema.  Pain is still localized near the base of the fifth metatarsal in the area of the os perineum and also just plantar to that.  The did not appear to be any pain over the calcaneocuboid area.  Neurologically intact.  Does have some pain with eversion of her foot in the area of the os peroneum  Specialty Comments:  No specialty comments available.  Imaging: No results found.   PMFS History: Patient Active Problem List   Diagnosis Date Noted   Pain  in left foot 03/22/2020   Bilateral primary osteoarthritis of knee 08/31/2019   Pain in right shoulder 03/30/2019   History reviewed. No pertinent past medical history.  Family History  Problem Relation Age of Onset   Diabetes Mother     Past Surgical History:  Procedure Laterality Date   CARPAL TUNNEL RELEASE Bilateral    CHOLECYSTECTOMY     OOPHORECTOMY     Social History   Occupational History   Not on file  Tobacco Use   Smoking status: Never   Smokeless tobacco: Never  Vaping Use   Vaping Use: Never used  Substance and Sexual Activity   Alcohol use: Yes    Comment: occasional    Drug use: No   Sexual activity: Not on file

## 2020-10-23 ENCOUNTER — Ambulatory Visit: Payer: Medicare HMO | Admitting: Orthopaedic Surgery

## 2020-10-24 ENCOUNTER — Ambulatory Visit: Payer: Medicare HMO | Admitting: Orthopaedic Surgery

## 2020-10-24 ENCOUNTER — Other Ambulatory Visit: Payer: Self-pay

## 2020-10-24 ENCOUNTER — Encounter: Payer: Self-pay | Admitting: Orthopaedic Surgery

## 2020-10-24 DIAGNOSIS — M79672 Pain in left foot: Secondary | ICD-10-CM | POA: Diagnosis not present

## 2020-10-24 NOTE — Progress Notes (Signed)
Office Visit Note   Patient: Lori Patterson           Date of Birth: 11-Jul-1950           MRN: [id number] Visit Date: 10/24/2020              Requested by: Elijio Miles., MD [address] [place],  [place] [address] PCP: Elijio Miles., MD   Assessment & Plan: Visit Diagnoses:  1. Pain in left foot     Plan: Katrianna continues to have a problem with her left foot localizing her pain to the lateral aspect of the midfoot.  She has had a prior MRI scan in July demonstrating inflammation of an os perineum and some fraying of the peroneus longus as it attaches to the to the os perineum.  She has tried the [employer], over-the-counter medicines and even Voltaren gel.  I have injected that area with cortisone without much relief.  In addition she has had an area of edema within the cuboid bone but I am not sure how much that is symptomatic.  She still is having compromise of her activities.  At this point I really think that surgery might be the best alternative.  She actually has made an appointment to see Dr. Victorino Dike.  I wholeheartedly agree.  I suspect he might suggest surgical exploration.  She will let me know.    Follow-Up Instructions: Return if symptoms worsen or fail to improve.   Orders:  No orders of the defined types were placed in this encounter.  No orders of the defined types were placed in this encounter.     Procedures: No procedures performed   Clinical Data: No additional findings.   Subjective: Chief Complaint  Patient presents with   Left Foot - Follow-up, Pain  Patient presents today for follow up on her left foot. She has been wearing an ASO brace and boot for the last month. She has not noticed any improvement except when wearing the boot. She does not take anything for pain.   HPI  Review of Systems   Objective: Vital Signs: There were no vitals taken for this visit.  Physical Exam Constitutional:      Appearance: She is well-developed.   Eyes:     Pupils: Pupils are equal, round, and reactive to light.  Pulmonary:     Effort: Pulmonary effort is normal.  Skin:    General: Skin is warm and dry.  Neurological:     Mental Status: She is alert and oriented to person, place, and time.  Psychiatric:        Behavior: Behavior normal.    Ortho Exam awake alert and oriented.  No acute distress.  Left foot was not hot red warm or swollen.  There is no ankle discomfort.  Neurologically intact.  There is still local tenderness just proximal to the base of the fifth metatarsal over the peroneal tendon insertion.  No specific pain at the cuboid bone  Specialty Comments:  No specialty comments available.  Imaging: No results found.   PMFS History: Patient Active Problem List   Diagnosis Date Noted   Pain in left foot 03/22/2020   Bilateral primary osteoarthritis of knee 08/31/2019   Pain in right shoulder 03/30/2019   History reviewed. No pertinent past medical history.  Family History  Problem Relation Age of Onset   Diabetes Mother     Past Surgical History:  Procedure Laterality Date   CARPAL TUNNEL RELEASE  Bilateral    CHOLECYSTECTOMY     OOPHORECTOMY     Social History   Occupational History   Not on file  Tobacco Use   Smoking status: Never   Smokeless tobacco: Never  Vaping Use   Vaping Use: Never used  Substance and Sexual Activity   Alcohol use: Yes    Comment: occasional    Drug use: No   Sexual activity: Not on file

## 2020-11-14 ENCOUNTER — Other Ambulatory Visit: Payer: Self-pay

## 2020-11-14 ENCOUNTER — Encounter (HOSPITAL_BASED_OUTPATIENT_CLINIC_OR_DEPARTMENT_OTHER): Payer: Self-pay

## 2020-11-14 ENCOUNTER — Emergency Department (HOSPITAL_BASED_OUTPATIENT_CLINIC_OR_DEPARTMENT_OTHER)
Admission: EM | Admit: 2020-11-14 | Discharge: 2020-11-14 | Disposition: A | Discharge status: Home / Self Care | Payer: Medicare HMO | Attending: Emergency Medicine | Admitting: Emergency Medicine

## 2020-11-14 DIAGNOSIS — L089 Local infection of the skin and subcutaneous tissue, unspecified: Secondary | ICD-10-CM | POA: Insufficient documentation

## 2020-11-14 MED ORDER — DIPHENHYDRAMINE HCL 50 MG/ML IJ SOLN
25.0000 mg | Freq: Once | INTRAMUSCULAR | Status: DC
  Filled 2020-11-14: qty 1

## 2020-11-14 MED ORDER — DIPHENHYDRAMINE HCL 50 MG/ML IJ SOLN
12.5000 mg | Freq: Once | INTRAMUSCULAR | Status: AC
  Administered 2020-11-14: 12.5 mg via INTRAVENOUS

## 2020-11-14 MED ORDER — VANCOMYCIN HCL IN DEXTROSE 1-5 GM/200ML-% IV SOLN
1000.0000 mg | Freq: Once | INTRAVENOUS | Status: AC
  Administered 2020-11-14: 1000 mg via INTRAVENOUS
  Filled 2020-11-14: qty 200

## 2020-11-14 NOTE — ED Notes (Signed)
Pt. Has no itching she states.  Pt. Redness has alsosubsided

## 2020-11-14 NOTE — ED Provider Notes (Signed)
[facility] EMERGENCY DEPARTMENT Provider Note   CSN: [id number] Arrival date & time: 11/14/20  1535     History Chief Complaint  Patient presents with   IV Medication    Lori Patterson is a 70 y.o. female.  Lori Patterson is a 69 y.o. female who is otherwise healthy, presents to the ED for IV antibiotics.  Patient was seen by Dr. Amanda Pea with hand surgery today for an infection to her right middle finger.  Dr. Marquette Old asked that she received 1.5 g of IV vancomycin for cellulitis of the finger.  He tried to get her into the [facility] infusion center but they did not have any appointments available today.  He is planning to see her back in his office for close follow-up tomorrow and did not feel that she needed labs, further work-up with hospital admission done here in the emergency department just wanted to make sure she can get a dose of IV antibiotics.  She is not having any fevers or chills, just redness, pain and swelling of the right finger.  The history is provided by the patient.      History reviewed. No pertinent past medical history.  Patient Active Problem List   Diagnosis Date Noted   Pain in left foot 03/22/2020   Bilateral primary osteoarthritis of knee 08/31/2019   Pain in right shoulder 03/30/2019    Past Surgical History:  Procedure Laterality Date   CARPAL TUNNEL RELEASE Bilateral    CHOLECYSTECTOMY     OOPHORECTOMY       OB History   No obstetric history on file.     Family History  Problem Relation Age of Onset   Diabetes Mother     Social History   Tobacco Use   Smoking status: Never   Smokeless tobacco: Never  Vaping Use   Vaping Use: Never used  Substance Use Topics   Alcohol use: Yes    Comment: occ   Drug use: No    Home Medications Prior to Admission medications   Medication Sig Start Date End Date Taking? Authorizing Provider  cholecalciferol (VITAMIN D) 1000 units tablet Take 2,000 Units by mouth daily.    [provider]  loratadine (CLARITIN) 10 MG tablet Take by mouth.    [provider]  metroNIDAZOLE (METROGEL) 0.75 % gel Apply 1 application topically daily.    [provider]    Allergies    Aspirin, Demerol [meperidine], and Doxycycline  Review of Systems   Review of Systems  Constitutional:  Negative for chills and fever.  Musculoskeletal:  Positive for arthralgias.  Skin:  Positive for color change.  All other systems reviewed and are negative.  Physical Exam Updated Vital Signs BP 117/74   Pulse 80   Temp 99.5 F (37.5 C) (Oral)   Resp 18   Ht 5' (1.524 m)   Wt 53.5 kg   SpO2 100%   BMI 23.05 kg/m   Physical Exam Vitals and nursing note reviewed.  Constitutional:      General: She is not in acute distress.    Appearance: Normal appearance. She is well-developed. She is not ill-appearing or diaphoretic.  HENT:     Head: Normocephalic and atraumatic.  Eyes:     General:        Right eye: No discharge.        Left eye: No discharge.  Cardiovascular:     Rate and Rhythm: Normal rate.  Pulmonary:  Effort: Pulmonary effort is normal. No respiratory distress.  Musculoskeletal:     Comments: Diffuse erythema and swelling of the right middle finger, able to fully straighten but decreased flexion due to pain.  No lymphangitic streaking.  Skin:    General: Skin is warm and dry.  Neurological:     Mental Status: She is alert and oriented to person, place, and time.     Coordination: Coordination normal.  Psychiatric:        Mood and Affect: Mood normal.        Behavior: Behavior normal.    ED Results / Procedures / Treatments   Labs (all labs ordered are listed, but only abnormal results are displayed) Labs Reviewed - No data to display  EKG None  Radiology No results found.  Procedures Procedures   Medications Ordered in ED Medications  vancomycin (VANCOCIN) IVPB 1000 mg/200 mL premix (0 mg Intravenous Stopped 11/14/20 1758)   diphenhydrAMINE (BENADRYL) injection 12.5 mg (12.5 mg Intravenous Given 11/14/20 1757)    ED Course  I have reviewed the triage vital signs and the nursing notes.  Pertinent labs & imaging results that were available during my care of the patient were reviewed by me and considered in my medical decision making (see chart for details).    MDM Rules/Calculators/A&P                           Patient sent to the ED by Dr. Amanda Pea with hand surgery for a dose of IV vancomycin, he had initially requested that the patient received 1.5 g, but pharmacy recommended reducing dose to 1 g based on patient's weight-based dosing to avoid renal injury, Dr. Amanda Pea is in agreement with this.  1 g IV vancomycin ordered for patient.  Per Dr. Amanda Pea she does not need labs or further evaluation in the ED, she has been seen in the office and will be seen again for close follow-up tomorrow.  As patient was finishing vancomycin infusion she began to have redness, flushing and itching starting at her torso and moving up towards her scalp.  No associated facial swelling, difficulty breathing.  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  Patient only had about 20 cc left in infusion so this was stopped and patient was given a low dose of Benadryl as she reports with typical doses they make her very tired.  Patient has been monitored in the ED and redness is resolving and she has had no worsening of reaction.  At this time patient is stable for discharge home with close follow-up with Dr. Amanda Pea tomorrow, he has prescribed her Bactrim to take orally for infection as well.  Final Clinical Impression(s) / ED Diagnoses Final diagnoses:  Finger infection    Rx / DC Orders ED Discharge Orders     None        Legrand Rams 11/14/20 Aretha Parrot    Arby Barrette, MD 11/26/20 [phone]

## 2020-11-14 NOTE — ED Triage Notes (Signed)
Pt states she was sent from Dr Amanda Pea for IV vancomycin-dx with cellulitis right middle finger-NAD-steady gait

## 2020-11-14 NOTE — ED Notes (Signed)
Pt. Is here due to her finger having cellulitis and the hand surgeon sending her here for IV abx treatment.

## 2020-11-14 NOTE — Discharge Instructions (Signed)
You have received your IV vancomycin today, please follow-up with Dr. Amanda Pea tomorrow as planned.

## 2020-11-14 NOTE — ED Notes (Signed)
Pt. Started to itch and said she was becoming red on her head and her neck and torso.  RN immediately stopped the Vancomycin.  Pt. Had no resp. Issues or facial swelling.  Pt. Had no oral swelling.  Pt. Able to speak complete sentences.    EDP called to room and she was at bedside immediately to see the Pt.

## 2021-02-13 ENCOUNTER — Encounter: Payer: Self-pay | Admitting: Physical Therapy

## 2021-02-13 ENCOUNTER — Ambulatory Visit: Payer: Medicare HMO | Attending: Orthopedic Surgery | Admitting: Physical Therapy

## 2021-02-13 ENCOUNTER — Other Ambulatory Visit: Payer: Self-pay

## 2021-02-13 DIAGNOSIS — M6281 Muscle weakness (generalized): Secondary | ICD-10-CM | POA: Insufficient documentation

## 2021-02-13 DIAGNOSIS — R2689 Other abnormalities of gait and mobility: Secondary | ICD-10-CM | POA: Diagnosis not present

## 2021-02-13 NOTE — Patient Instructions (Signed)
Access Code: [email or web address] URL: [email or web address] Date: 02/13/2021 Prepared by: Isabelle Course  Exercises Long Sitting Ankle Dorsiflexion with Anchored Resistance - 1 x daily - 7 x weekly - 2 sets - 10 reps Long Sitting Ankle Eversion with Resistance - 1 x daily - 7 x weekly - 2 sets - 10 reps Long Sitting Ankle Inversion with Resistance - 1 x daily - 7 x weekly - 2 sets - 10 reps Long Sitting Ankle Plantar Flexion with Resistance - 1 x daily - 7 x weekly - 2 sets - 10 reps Gastroc Stretch on Wall - 1 x daily - 7 x weekly - 3 sets - 1 reps - 20-30 seconds hold Soleus Stretch on Wall - 1 x daily - 7 x weekly - 3 sets - 1 reps - 20-30 seconds hold Seated Ankle Inversion Eversion PROM - 1 x daily - 7 x weekly - 1 sets - 10 reps Standing Single Leg Stance with Counter Support - 1 x daily - 7 x weekly - 3 sets - 1 reps - up to 30 seconds hold

## 2021-02-13 NOTE — Therapy (Signed)
[facility] [place] [address] [place], [place], [address] Phone: [phone]   Fax:  [phone]  Physical Therapy Evaluation  Patient Details  Name: Lori Patterson MRN: [id number] Date of Birth: Dec 03, 1950 Referring Provider (PT): Toni Arthurs   Encounter Date: 02/13/2021   PT End of Session - 02/13/21 1229     Visit Number 1    Number of Visits 6    Date for PT Re-Evaluation 03/27/21    Authorization Type aetna medicare    Authorization - Visit Number 1    Progress Note Due on Visit 10    PT Start Time 1140    PT Stop Time 1221    PT Time Calculation (min) 41 min    Activity Tolerance Patient tolerated treatment well    Behavior During Therapy [facility] for tasks assessed/performed             History reviewed. No pertinent past medical history.  Past Surgical History:  Procedure Laterality Date   CARPAL TUNNEL RELEASE Bilateral    CHOLECYSTECTOMY     OOPHORECTOMY      There were no vitals filed for this visit.    Subjective Assessment - 02/13/21 1145     Subjective Pt had a Lt LE stress fracture 03/2020 but did not treat it as she just wanted to "keep moving".  In June 2022 she had an incident with much increased pain. Eventually pt had MRI and was diagnosed with peroneal fraying. Pt was put in boot for weeks and referred to foot and ankle specialist. Pt had surgery 12/18/2020. Pt was initially in boot but is now weaning and wearing tennis shoes most of the day. Pt reports occasional "burning" in her Lt ankle but no c/o pain. She states she feels "minimal pain" after prolonged standing but it resolves with OTC meds.    Limitations Standing    How long can you stand comfortably? 1 hour    Patient Stated Goals improve strength    Currently in Pain? No/denies                [facility] PT Assessment - 02/13/21 0001       Assessment   Medical Diagnosis Left peroneal tendonitis    Referring Provider (PT) Toni Arthurs     Onset Date/Surgical Date 12/18/20    Next MD Visit 03/2021      Precautions   Precautions None      Restrictions   Weight Bearing Restrictions No      Balance Screen   Has the patient fallen in the past 6 months No      Home Environment   Additional Comments one step to enter, 1 level home      Prior Function   Level of Independence Independent    Vocation Requirements retired      Observation/Other Assessments   Focus on Therapeutic Outcomes (FOTO)  78      Functional Tests   Functional tests Single leg stance      Single Leg Stance   Comments Rt > 15sec, Lt 3 sec      ROM / Strength   AROM / PROM / Strength AROM;Strength      AROM   AROM Assessment Site Ankle    Right/Left Ankle Right;Left    Right Ankle Dorsiflexion 11    Right Ankle Plantar Flexion 40    Right Ankle Inversion 35    Right Ankle Eversion 35    Left Ankle Dorsiflexion  11    Left Ankle Plantar Flexion 38    Left Ankle Inversion 29    Left Ankle Eversion 18      Strength   Strength Assessment Site Ankle    Right/Left Ankle Right;Left    Right Ankle Dorsiflexion 4+/5    Right Ankle Plantar Flexion 4+/5    Right Ankle Inversion 4/5    Right Ankle Eversion 4/5    Left Ankle Dorsiflexion 4+/5    Left Ankle Plantar Flexion 4-/5    Left Ankle Inversion 4/5    Left Ankle Eversion 4/5      Palpation   Palpation comment mild swelling Lt lateral maleolus, hypomobile with talocrural mobs                        Objective measurements completed on examination: See above findings.       [facility] Adult PT Treatment/Exercise - 02/13/21 0001       Exercises   Exercises Ankle      Ankle Exercises: Stretches   Soleus Stretch 30 seconds    Soleus Stretch Limitations standing    Gastroc Stretch 30 seconds    Gastroc Stretch Limitations standing    Other Stretch seated inversion stretch x 20 secs      Ankle Exercises: Standing   Other Standing Ankle Exercises SLS with intermittent  UE support      Ankle Exercises: Supine   T-Band long sitting ankle 4 way red TB x 10 each direction                     PT Education - 02/13/21 1216     Education Details PT POC and goals, HEP    Person(s) Educated Patient    Methods Explanation;Demonstration;Handout    Comprehension Returned demonstration;Verbalized understanding                 PT Long Term Goals - 02/13/21 1233       PT LONG TERM GOAL #1   Title Pt will be independent with HEP    Time 6    Period Weeks    Status New    Target Date 03/27/21      PT LONG TERM GOAL #2   Title Pt will improve FOTO to >= 81 to demo improved functional mobility    Time 6    Period Weeks    Status New    Target Date 03/27/21      PT LONG TERM GOAL #3   Title Pt will improve SLS on Lt LE to >= 15 to demo improved balance and decreased risk of falls    Time 6    Period Weeks    Status New    Target Date 03/27/21      PT LONG TERM GOAL #4   Title Pt will improve Lt ankle inversion ROM by 10 degrees to improve mobility with gait and stair negotiation    Time 6    Period Weeks    Status New    Target Date 03/27/21                    Plan - 02/13/21 1231     Clinical Impression Statement Pt is a 71 y/o female referred for peroneal tendonitis. She presents with decreased Lt ankle ROM and strength, decreased balance and activity tolerance and will benefit from skilled PT to address deficits and improve functional mobility  Personal Factors and Comorbidities Comorbidity 1    Examination-Activity Limitations Stand;Locomotion Level    Examination-Participation Restrictions Community Activity;Meal Prep    Stability/Clinical Decision Making Stable/Uncomplicated    Clinical Decision Making Low    Rehab Potential Good    PT Frequency 1x / week    PT Duration 6 weeks    PT Treatment/Interventions Aquatic Therapy;Cryotherapy;Moist Heat;Electrical Stimulation;Gait training;Stair  training;Neuromuscular re-education;Balance training;Therapeutic exercise;Therapeutic activities;Patient/family education;Manual techniques;Passive range of motion;Dry needling;Taping;Vasopneumatic Device    PT Next Visit Plan assess progress with HEP since eval, progress and update HEP as needed    PT Home Exercise Plan [id number]    Consulted and Agree with Plan of Care Patient             Patient will benefit from skilled therapeutic intervention in order to improve the following deficits and impairments:  Pain, Impaired flexibility, Decreased strength, Decreased activity tolerance, Decreased balance, Decreased endurance, Decreased range of motion  Visit Diagnosis: Muscle weakness (generalized) - Plan: PT plan of care cert/re-cert  Balance problem - Plan: PT plan of care cert/re-cert     Problem List Patient Active Problem List   Diagnosis Date Noted   Pain in left foot 03/22/2020   Bilateral primary osteoarthritis of knee 08/31/2019   Pain in right shoulder 03/30/2019    Demarquez Ciolek, PT 02/13/2021, 12:38 PM  [facility] [address] [place], [place], [address] Phone: [phone]   Fax:  [phone]  Name: Gerre CouchJoyce Sliva MRN: [id number] Date of Birth: [birth year]

## 2021-02-27 ENCOUNTER — Other Ambulatory Visit: Payer: Self-pay

## 2021-02-27 ENCOUNTER — Ambulatory Visit: Payer: Medicare HMO | Admitting: Physical Therapy

## 2021-02-27 DIAGNOSIS — M6281 Muscle weakness (generalized): Secondary | ICD-10-CM | POA: Diagnosis not present

## 2021-02-27 DIAGNOSIS — R2689 Other abnormalities of gait and mobility: Secondary | ICD-10-CM

## 2021-02-27 NOTE — Therapy (Addendum)
[place], [place], [address] Phone: [phone]   Fax:  [phone]  Physical Therapy Treatment and Discharge  Patient Details  Name: Lori Patterson MRN: [id number] Date of Birth: 11-22-1950 Referring Provider (PT): Wylene Simmer   Encounter Date: 02/27/2021   PT End of Session - 02/27/21 1055     Visit Number 2    Number of Visits 6    Date for PT Re-Evaluation 03/27/21    Authorization Type aetna medicare    Authorization - Visit Number 2    Progress Note Due on Visit 10    PT Start Time 2229    PT Stop Time 1057    PT Time Calculation (min) 42 min    Activity Tolerance Patient tolerated treatment well    Behavior During Therapy [facility] for tasks assessed/performed             No past medical history on file.  Past Surgical History:  Procedure Laterality Date   CARPAL TUNNEL RELEASE Bilateral    CHOLECYSTECTOMY     OOPHORECTOMY      There were no vitals filed for this visit.   Subjective Assessment - 02/27/21 1015     Subjective PT states she has been performing her HEP but is more limited by her knee pain than her ankle    Patient Stated Goals improve strength    Currently in Pain? No/denies                [facility] PT Assessment - 02/27/21 0001       Assessment   Medical Diagnosis Left peroneal tendonitis    Referring Provider (PT) Wylene Simmer    Onset Date/Surgical Date 12/18/20    Next MD Visit 03/2021      AROM   Left Ankle Inversion 30    Left Ankle Eversion 20                           [facility] Adult PT Treatment/Exercise - 02/27/21 0001       Manual Therapy   Manual Therapy Passive ROM    Passive ROM Lt ankle inversion/eversion      Ankle Exercises: Aerobic   Recumbent Bike L3 x 5 min for warm up      Ankle Exercises: Stretches   Soleus Stretch 30 seconds    Soleus Stretch Limitations standing - cues for form    Gastroc Stretch 30 seconds     Gastroc Stretch Limitations standing with cues for form      Ankle Exercises: Seated   Towel Inversion/Eversion --   x10 bilat   BAPS Level 2;Sitting;10 reps      Ankle Exercises: Supine   T-Band long sitting ankle 4 way red TB x 10 each direction      Ankle Exercises: Standing   SLS max hold x 5 attempts    Rocker Board 1 minute   1 min rocking A/P then 30 sec hold, then rocking laterally 1 min                         PT Long Term Goals - 02/13/21 1233       PT LONG TERM GOAL #1   Title Pt will be independent with HEP    Time 6    Period Weeks    Status New    Target Date 03/27/21  PT LONG TERM GOAL #2   Title Pt will improve FOTO to >= 81 to demo improved functional mobility    Time 6    Period Weeks    Status New    Target Date 03/27/21      PT LONG TERM GOAL #3   Title Pt will improve SLS on Lt LE to >= 15 to demo improved balance and decreased risk of falls    Time 6    Period Weeks    Status New    Target Date 03/27/21      PT LONG TERM GOAL #4   Title Pt will improve Lt ankle inversion ROM by 10 degrees to improve mobility with gait and stair negotiation    Time 6    Period Weeks    Status New    Target Date 03/27/21                   Plan - 02/27/21 1057     Clinical Impression Statement Pt continues with decreased ROM in ankle inversion and eversion but decreased pain with stretching and mobility. She requires assistance for coordination with [place] board. good tolerance of standing exercises. She states she feels comfortable with HEP for strengthening and stretching    PT Next Visit Plan assess progress with HEP, progress and update HEP as needed    PT Home Exercise Plan [id number]    Consulted and Agree with Plan of Care Patient             Patient will benefit from skilled therapeutic intervention in order to improve the following deficits and impairments:     Visit Diagnosis: Muscle weakness  (generalized)  Balance problem     Problem List Patient Active Problem List   Diagnosis Date Noted   Pain in left foot 03/22/2020   Bilateral primary osteoarthritis of knee 08/31/2019   Pain in right shoulder 03/30/2019  PHYSICAL THERAPY DISCHARGE SUMMARY  Visits from Start of Care: 2  Current functional level related to goals / functional outcomes: Improved mobility and knowledge of HEP   Remaining deficits: See above   Education / Equipment: HEP   Patient agrees to discharge. Patient goals were partially met. Patient is being discharged due to not returning since the last visit. Isabelle Course, PT,DPT02/07/238:36 AM    Isabelle Course, PT 02/27/2021, [place], [place], [address] Phone: [phone]   Fax:  [phone]  Name: Lori Patterson MRN: [id number] Date of Birth: 06-23-1950

## 2022-01-03 IMAGING — MR MR FOOT*L* W/O CM
5 series · 33 of 40 positions shown · non-contrast
Comparison: Left foot radiograph 03/22/2020

CLINICAL DATA: Lateral foot pain

EXAM:
MRI OF THE LEFT FOOT WITHOUT CONTRAST
TECHNIQUE: Multiplanar, multisequence MR imaging of the left foot was
performed. No intravenous contrast was administered.

[Series 4: T1 · coronal · 3.0mm · 0.34mm/px · 8 of 46 slices shown (1 of 2)]
[im 1/46]
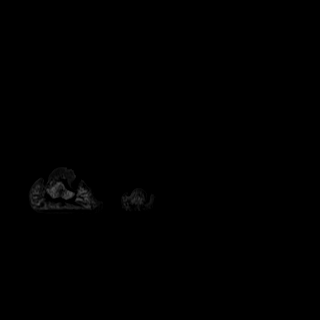
[im 6/46]
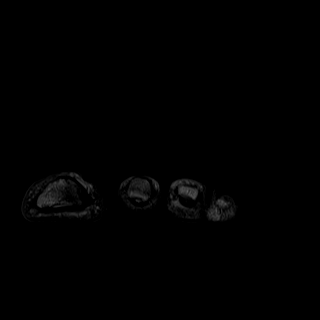
[im 16/46]
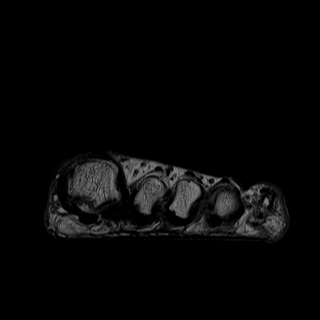
[im 21/46]
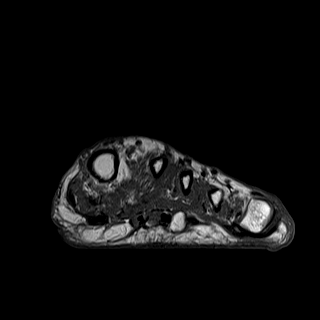
[im 26/46]
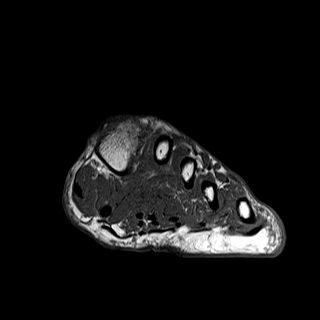
[im 31/46]
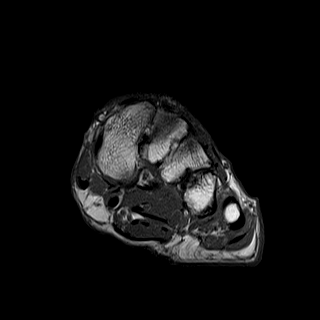
[im 41/46]
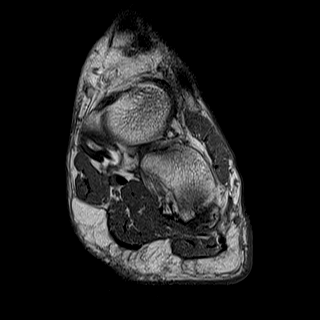
[im 46/46]
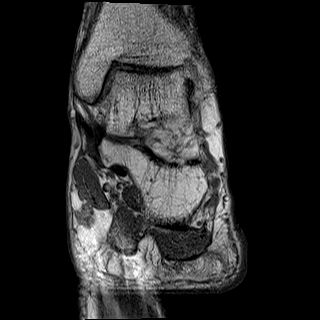

[Series 5: T2 fat-sat · coronal · 3.0mm · 0.21mm/px · 11 of 46 slices shown (1 of 2)]
[im 1/46]
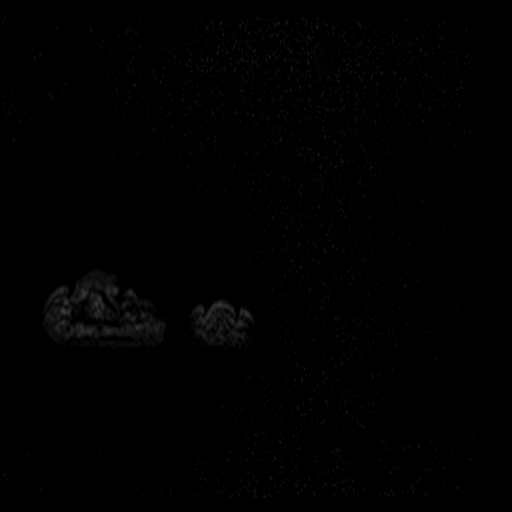
[im 5/46]
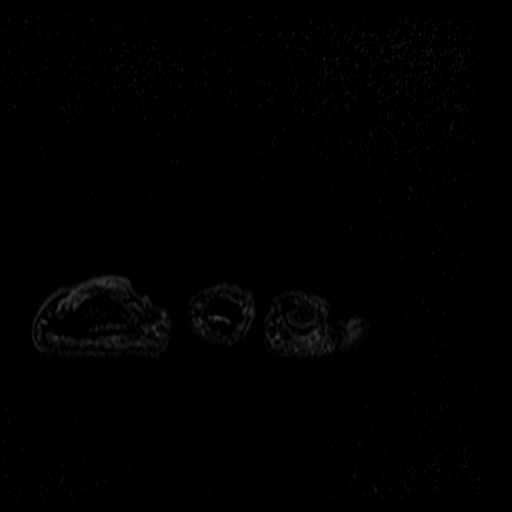
[im 10/46]
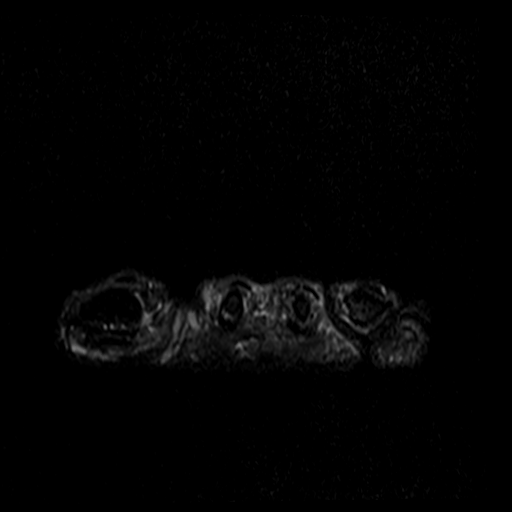
[im 14/46]
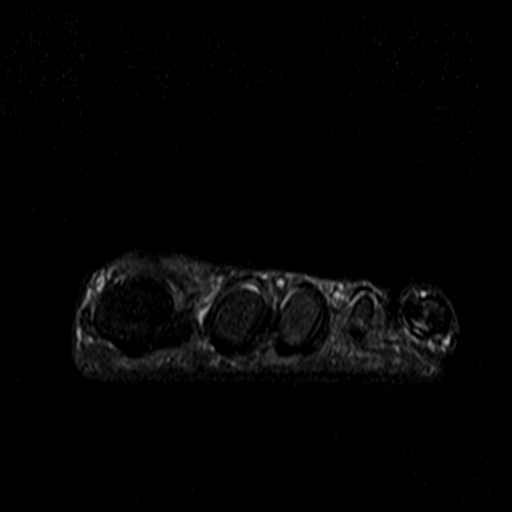
[im 19/46]
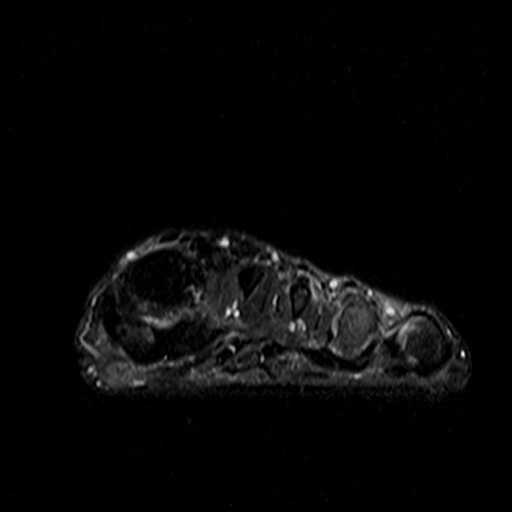
[im 23/46]
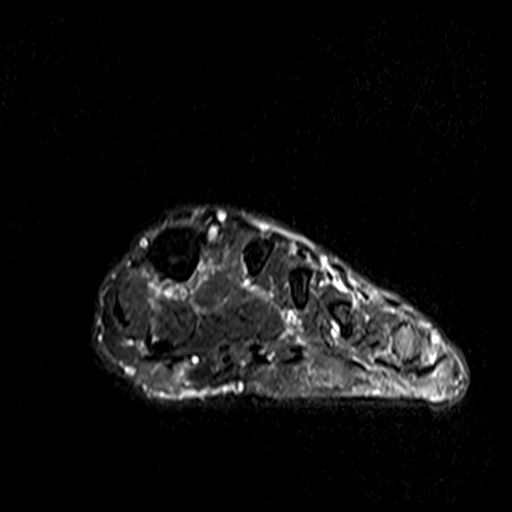
[im 28/46]
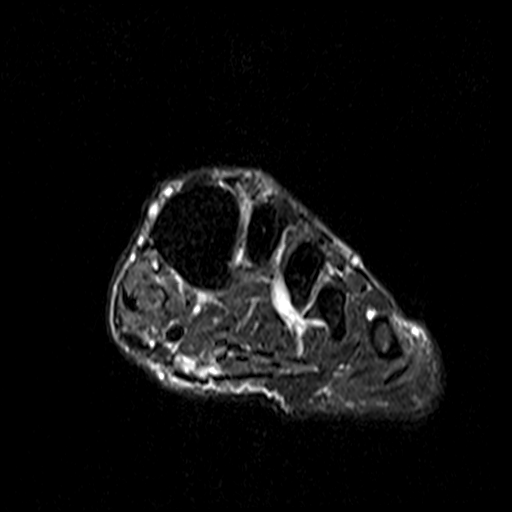
[im 32/46]
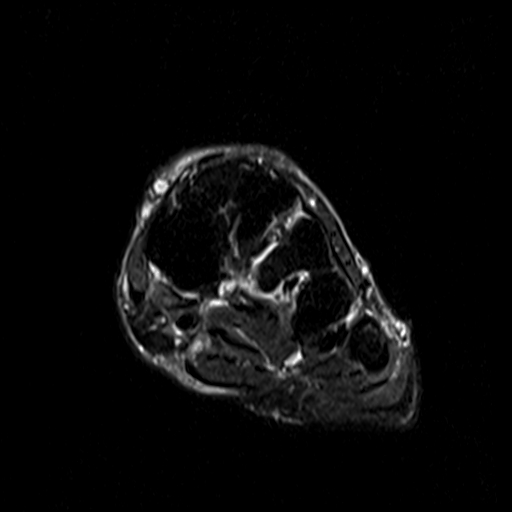
[im 37/46]
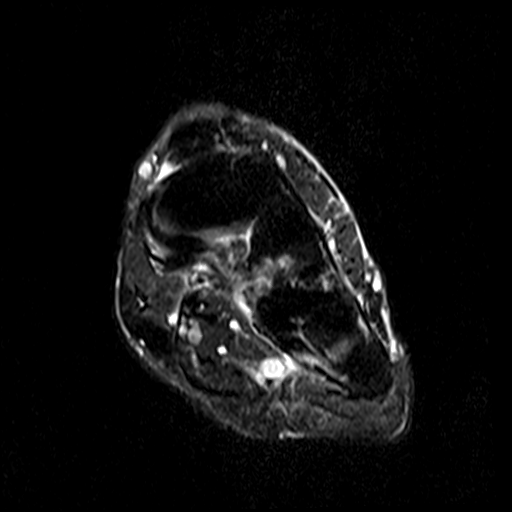
[im 41/46]
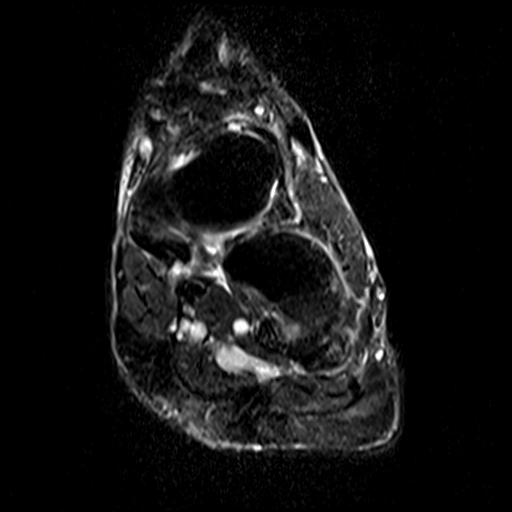
[im 46/46]
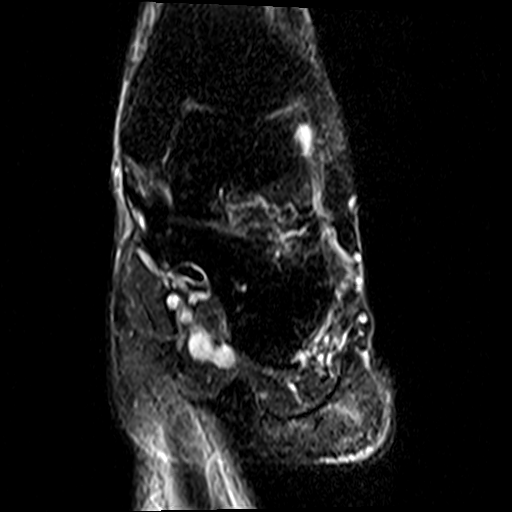

[Series 6: T1 · axial · 3.0mm · 0.50mm/px · z∈[-52,+19]mm · 6 of 24 slices shown (2 of 2)]
[im 1/24]
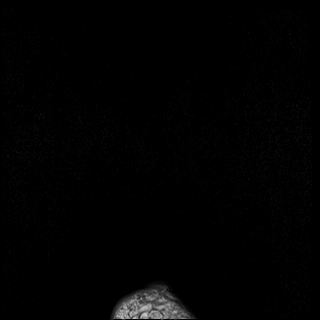
[im 5/24]
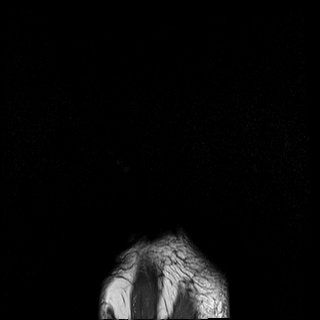
[im 10/24]
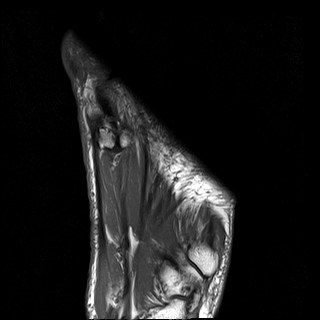
[im 14/24]
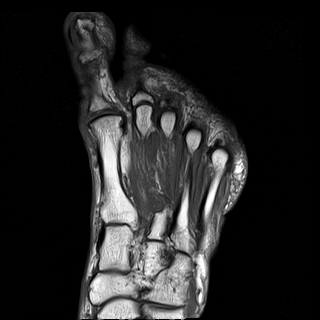
[im 19/24]
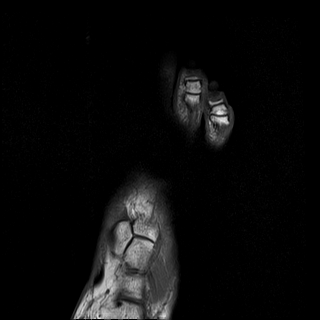
[im 24/24]
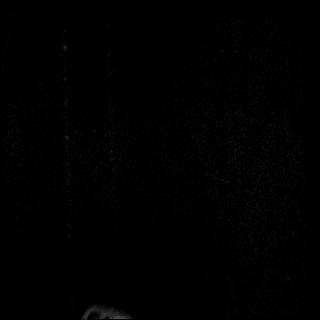

[Series 7: T2 fat-sat · axial · 3.0mm · 0.66mm/px · z∈[-52,+19]mm · 6 of 24 slices shown (2 of 2)]
[im 1/24]
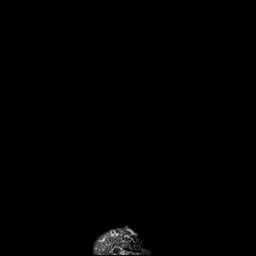
[im 5/24]
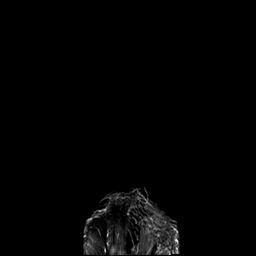
[im 10/24]
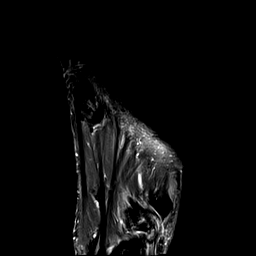
[im 14/24]
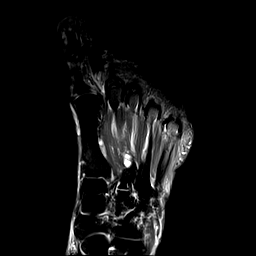
[im 19/24]
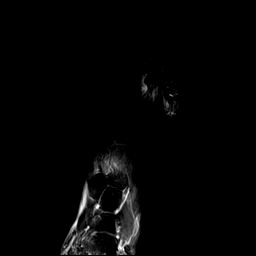
[im 24/24]
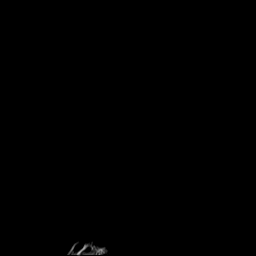

[Series 10: STIR · sagittal · 3.0mm · 0.70mm/px · 2 of 30 slices shown]
[im 1/30]
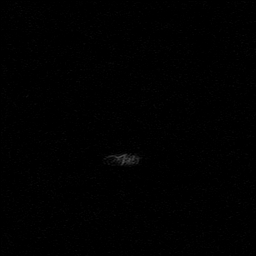
[im 5/30]
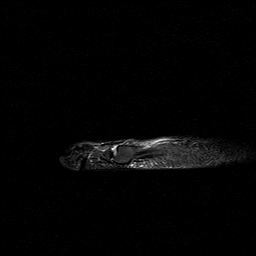

[33 of 40 positions shown; findings below may reference images not displayed]

FINDINGS: Bones/Joint/Cartilage

There is serpiginous linear bony edema within the cuboid. There is
bony edema within an os peroneum. There is fraying/partial tearing
of the peroneal longus tendon as it courses into the os peroneum
(axial T2 image 133). There is adjacent soft tissue edema.

Ligaments

Intact Lisfranc ligament.  No evidence of plantar plate tear

Muscles and Tendons

No significant muscle atrophy. No acute tendon tear in the forefoot.

Soft tissues

No evidence of soft tissue mass. There is no intermetatarsal neuroma
or bursitis.
IMPRESSION: Findings suggestive of os peroneum syndrome, with bony edema within
the accessory ossicle and fraying/partial tearing of the peroneal
longus tendon proximally.

Adjacent linear signal abnormality in the cuboid, which could
represent stress reaction or healing stress fracture.
